# Patient Record
Sex: Female | Born: 1975 | Hispanic: Yes | Marital: Married | Smoking: Never smoker
Health system: Southern US, Community
[De-identification: ages and names within clinical notes are randomized; demographics above are authoritative.]

## PROBLEM LIST (undated history)

## (undated) DIAGNOSIS — I10 Essential (primary) hypertension: Secondary | ICD-10-CM

## (undated) DIAGNOSIS — E041 Nontoxic single thyroid nodule: Secondary | ICD-10-CM

## (undated) DIAGNOSIS — Z789 Other specified health status: Secondary | ICD-10-CM

## (undated) HISTORY — PX: CHOLECYSTECTOMY: SHX55

## (undated) HISTORY — PX: APPENDECTOMY: SHX54

## (undated) HISTORY — DX: Essential (primary) hypertension: I10

---

## 2005-05-17 ENCOUNTER — Ambulatory Visit: Payer: Self-pay | Admitting: Family Medicine

## 2013-09-25 ENCOUNTER — Emergency Department: Payer: Self-pay | Admitting: Emergency Medicine

## 2013-09-25 LAB — CBC WITH DIFFERENTIAL/PLATELET
Basophil #: 0.1 10*3/uL (ref 0.0–0.1)
Basophil %: 1.1 %
EOS ABS: 0.1 10*3/uL (ref 0.0–0.7)
Eosinophil %: 0.9 %
HCT: 40.4 % (ref 35.0–47.0)
HGB: 13.5 g/dL (ref 12.0–16.0)
LYMPHS PCT: 24.4 %
Lymphocyte #: 2.6 10*3/uL (ref 1.0–3.6)
MCH: 29.1 pg (ref 26.0–34.0)
MCHC: 33.4 g/dL (ref 32.0–36.0)
MCV: 87 fL (ref 80–100)
Monocyte #: 0.6 x10 3/mm (ref 0.2–0.9)
Monocyte %: 5.5 %
NEUTROS ABS: 7.2 10*3/uL — AB (ref 1.4–6.5)
Neutrophil %: 68.1 %
Platelet: 201 10*3/uL (ref 150–440)
RBC: 4.64 10*6/uL (ref 3.80–5.20)
RDW: 13.3 % (ref 11.5–14.5)
WBC: 10.6 10*3/uL (ref 3.6–11.0)

## 2013-09-25 LAB — URINALYSIS, COMPLETE
BLOOD: NEGATIVE
Bilirubin,UR: NEGATIVE
Glucose,UR: NEGATIVE mg/dL (ref 0–75)
KETONE: NEGATIVE
Nitrite: NEGATIVE
Ph: 8 (ref 4.5–8.0)
Protein: NEGATIVE
RBC,UR: NONE SEEN /HPF (ref 0–5)
Specific Gravity: 1.004 (ref 1.003–1.030)
Squamous Epithelial: 4
WBC UR: 2 /HPF (ref 0–5)

## 2013-09-25 LAB — COMPREHENSIVE METABOLIC PANEL
ALT: 19 U/L (ref 12–78)
ANION GAP: 4 — AB (ref 7–16)
AST: 12 U/L — AB (ref 15–37)
Albumin: 3.8 g/dL (ref 3.4–5.0)
Alkaline Phosphatase: 88 U/L
BUN: 10 mg/dL (ref 7–18)
Bilirubin,Total: 0.2 mg/dL (ref 0.2–1.0)
CHLORIDE: 105 mmol/L (ref 98–107)
CO2: 28 mmol/L (ref 21–32)
Calcium, Total: 8.9 mg/dL (ref 8.5–10.1)
Creatinine: 0.6 mg/dL (ref 0.60–1.30)
EGFR (African American): >60
EGFR (Non-African Amer.): >60
GLUCOSE: 112 mg/dL — AB (ref 65–99)
OSMOLALITY: 274 (ref 275–301)
Potassium: 3.8 mmol/L (ref 3.5–5.1)
Sodium: 137 mmol/L (ref 136–145)
Total Protein: 8.5 g/dL — ABNORMAL HIGH (ref 6.4–8.2)

## 2013-09-25 LAB — LIPASE, BLOOD: LIPASE: 155 U/L (ref 73–393)

## 2013-10-08 ENCOUNTER — Ambulatory Visit: Payer: Self-pay | Admitting: Surgery

## 2013-10-17 ENCOUNTER — Ambulatory Visit: Payer: Self-pay | Admitting: Surgery

## 2013-10-18 LAB — PATHOLOGY REPORT

## 2014-01-21 ENCOUNTER — Ambulatory Visit: Payer: Self-pay | Admitting: Primary Care

## 2014-10-19 NOTE — Op Note (Signed)
PATIENT NAME:  Sandra Little, Keylin MR#:  [id number] DATE OF BIRTH:  December 10, 1975  DATE OF PROCEDURE:  10/17/2013  PREOPERATIVE DIAGNOSIS: Symptomatic cholelithiasis.   POSTOPERATIVE DIAGNOSIS: Symptomatic cholelithiasis.   PROCEDURE: Laparoscopic cholecystectomy.   SURGEON: Dionne Miloichard Hisako Bugh, M.D.   ASSISTANT: Alpha GulaJosh Storm, PA-S.   INDICATIONS: This is a patient with recurrent epigastric and right upper quadrant pain associated with fatty food intolerance and work-up showing gallstones. Preoperatively, we discussed rationale for surgery, the options of observation, risk of bleeding, infection, recurrence of symptoms, failure to resolve her symptoms, open procedure, bile duct damage, bile duct leak, retained common bile duct stone, any of which could require further surgery and/or ERCP stent and papillotomy. This was reviewed for her and her family in the preop holding area with the aid of an interpreter. She understood and agreed to proceed.   FINDINGS: Multiple large gallstones requiring enlargement of the upper incision.   DESCRIPTION OF PROCEDURE: The patient was induced to general anesthesia, given IV antibiotics. VTE prophylaxis was in place. She was prepped and draped in a sterile fashion. Marcaine was infiltrated in skin and subcutaneous tissues around the periumbilical area. Incision was made. Veress needle was placed. Pneumoperitoneum was obtained and a 5 mm trocar port was placed. The abdominal cavity was explored and under direct vision a 10 mm epigastric port and two lateral 5 mm ports were placed. The gallbladder was placed on tension. Adhesions were taken down bluntly. The peritoneum over the infundibulum was incised bluntly. The cystic duct gallbladder junction was well identified and doubly clipped and divided. The cystic artery was doubly clipped and divided in two branches, and the gallbladder was taken from the gallbladder fossa with electrocautery and passed out through the epigastric  port site with the aid of an Endo Catch bag. The area was checked for hemostasis with electrocautery and found to be adequate. The area was irrigated with copious amounts of normal saline. There was no sign of bleeding, bile leak or bowel injury. The camera was placed in the epigastric site to view back at the periumbilical site. There was no sign of adhesions or bowel injury. Therefore, pneumoperitoneum was released. All ports were removed. Fascial edges of the enlarged epigastric port site were approximated with figure-of-eight 0 Vicryl, then 4-0 subcuticular Monocryl was used on all skin edges. Steri-Strips, Mastisol and sterile dressings were placed.   The patient tolerated the procedure well. There were no complications. She was taken to the recovery room in stable condition to be discharged in the care of her family. Follow-up in 10 days.   ____________________________ Adah Salvageichard E. Excell Seltzerooper, MD rec:sg D: 10/17/2013 08:12:15 ET T: 10/17/2013 08:25:20 ET JOB#: [id number]  cc: Adah Salvageichard E. Excell Seltzerooper, MD, <Dictator> Lattie HawICHARD E Virgen Belland MD ELECTRONICALLY SIGNED 10/17/2013 16:32

## 2014-10-19 NOTE — H&P (Signed)
PATIENT NAME:  Sandra Little, Jaclynn MR#:  [id number] DATE OF BIRTH:  1976-05-06  DATE OF ADMISSION:  10/17/2013  DATE OF OPERATION:  Will be 10/17/13.  CHIEF COMPLAINT: Recurrent right upper quadrant pain.   HISTORY OF PRESENT ILLNESS: This is a 39 year old female patient with a history of recurrent epigastric and right upper quadrant pain associated with fatty food intolerance. Her symptoms started several weeks ago. She has not had much nausea and vomiting or diarrhea but her pain is in the epigastrium and right upper quadrant, occasionally radiating to her back but not significantly. She is here for elective laparoscopic cholecystectomy for control of her symptoms following a workup showing gallstones.   PAST MEDICAL HISTORY: Hypertension.   PAST SURGICAL HISTORY: None.   ALLERGIES: None.   MEDICATIONS: Hydrochlorothiazide.   FAMILY HISTORY: Noncontributory.   SOCIAL HISTORY: The patient is a nonsmoker, nondrinker.   REVIEW OF SYSTEMS: A 10-system review has been performed and negative with the exception of that mentioned in the history of present illness and her ROS is documented in the office chart.   PHYSICAL EXAMINATION: GENERAL: Healthy female patient.  HEENT: No scleral icterus.  NECK: No palpable neck nodes.  CHEST: Clear to auscultation.  CARDIAC: Regular rate and rhythm.  ABDOMEN: Soft, nontender.  EXTREMITIES: Without edema.  NEUROLOGIC: Grossly intact.  INTEGUMENT: No jaundice.   LABORATORY, DIAGNOSTIC AND RADIOLOGICAL DATA: 1.  Ultrasound shows gallstones in the gallbladder, common bile duct 4 mm.  2.  Liver function tests are within normal limits. White blood cell count of 10, hemoglobin and hematocrit of 13 and 40 with a platelet count of 201. Electrolytes are within normal limits.   ASSESSMENT AND PLAN: This is a patient with symptomatic cholelithiasis. She is here for elective laparoscopic cholecystectomy for control of her symptoms. The rationale for this has  been discussed. The options of observation have been reviewed. The risks of bleeding, infection, recurrence of symptoms, failure to resolve her symptoms, open procedure, bile duct damage, bile duct leak, retained common bile duct stone, any of which could require further surgery and/or endoscopic retrograde cholangiopancreatography, stent and papillotomy have all been reviewed. She understood and agreed to proceed.   ____________________________ Adah Salvageichard E. Excell Seltzerooper, MD rec:cs D: 10/16/2013 15:24:32 ET T: 10/16/2013 15:46:15 ET JOB#: [id number]  cc: Adah Salvageichard E. Excell Seltzerooper, MD, <Dictator> Lattie HawICHARD E Larysa Pall MD ELECTRONICALLY SIGNED 10/16/2013 18:19

## 2015-06-24 IMAGING — US ABDOMEN ULTRASOUND LIMITED
1 series · 14 of 25 positions shown · non-contrast
Comparison: None currently available

CLINICAL DATA: Right upper quadrant pain and nausea

EXAM:
US ABDOMEN LIMITED - RIGHT UPPER QUADRANT

[Series 1: abdomen ultrasound limited · 0.27mm/px · 14 of 34 slices shown]
[im 1/34]
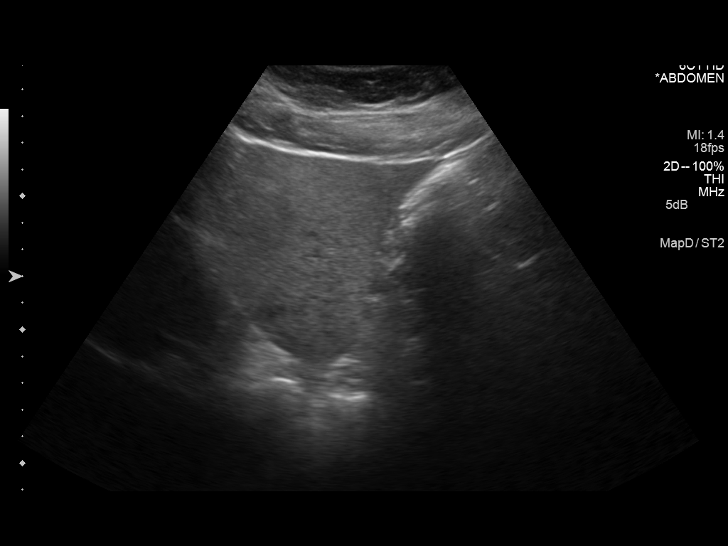
[im 3/34]
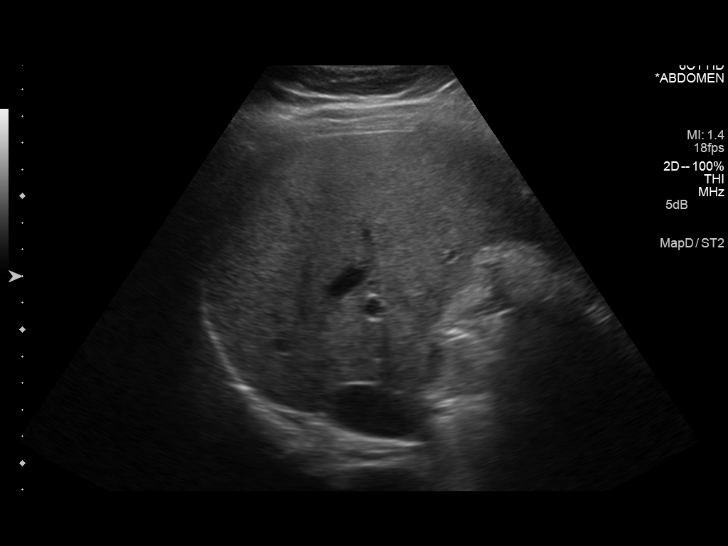
[im 6/34]
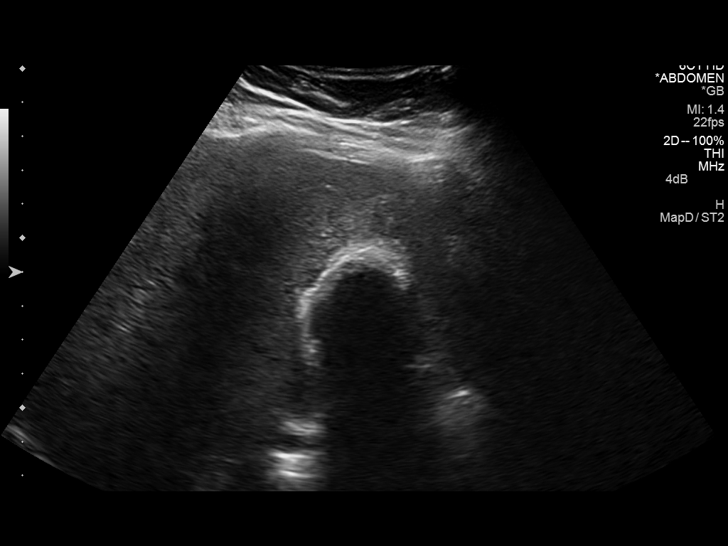
[im 9/34]
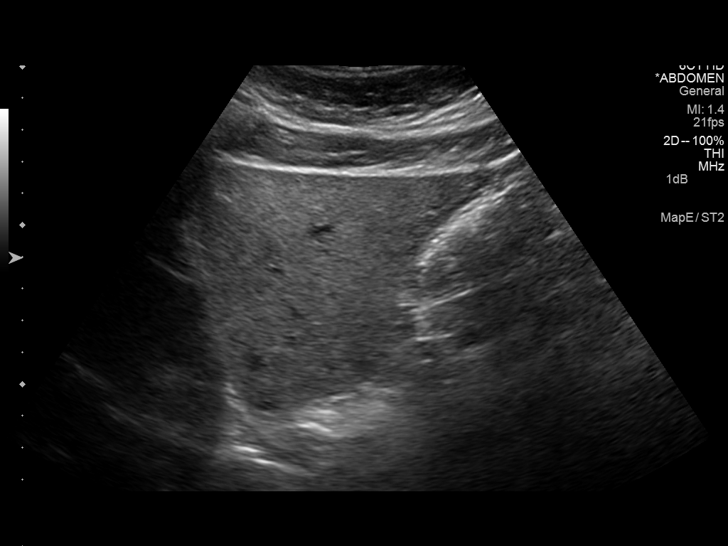
[im 12/34]
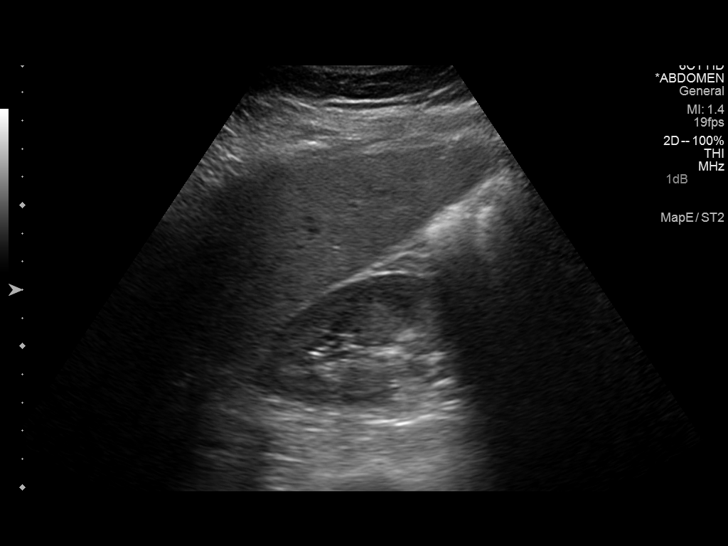
[im 13/34]
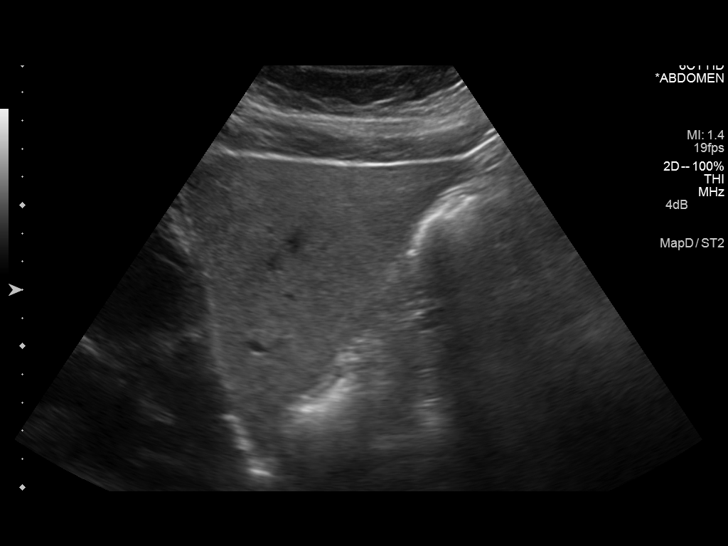
[im 16/34]
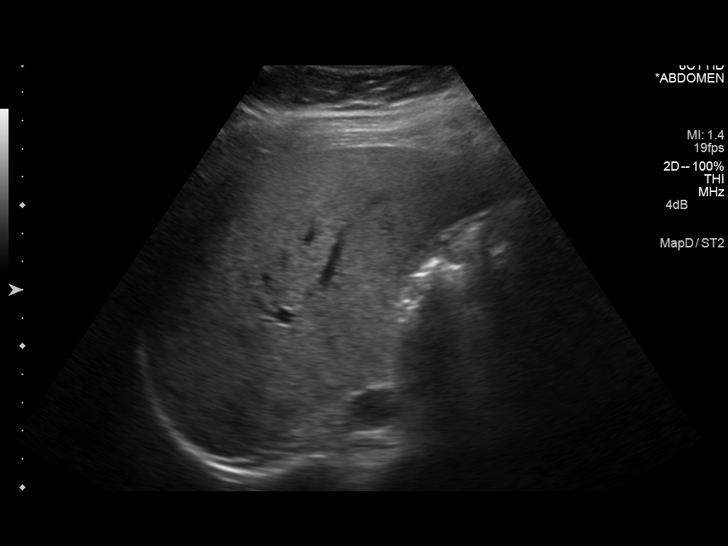
[im 18/34]
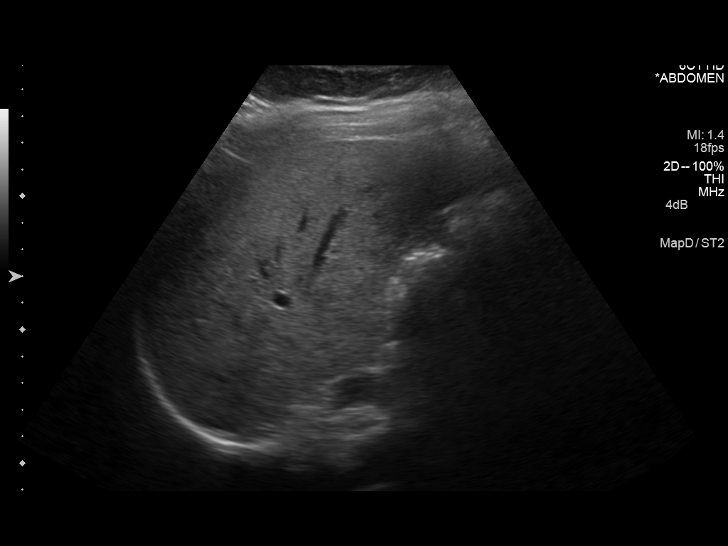
[im 21/34]
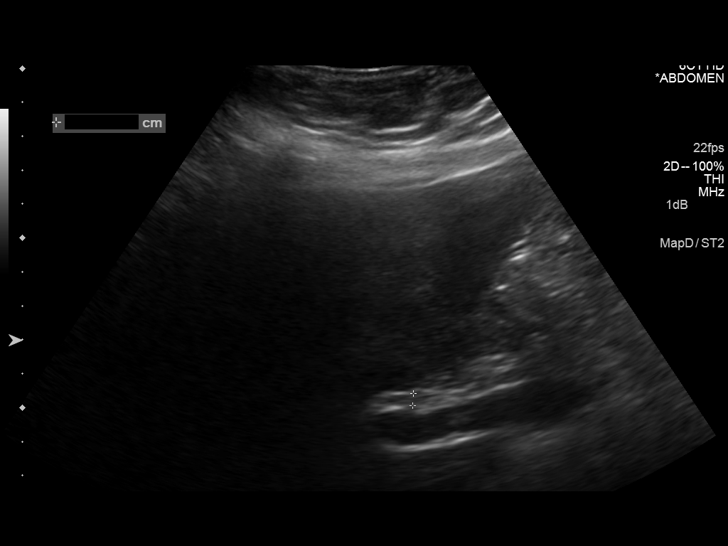
[im 23/34]
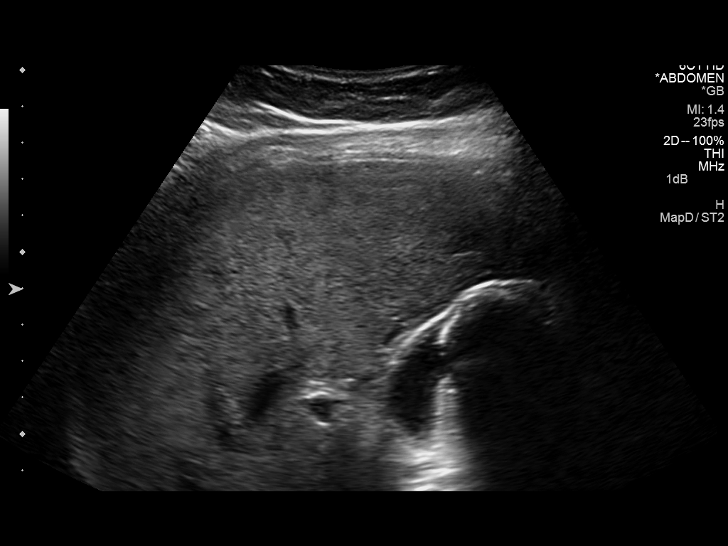
[im 25/34]
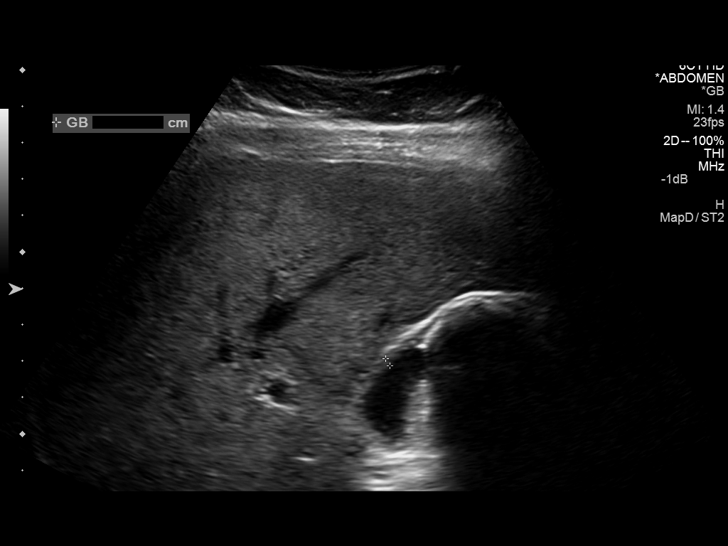
[im 28/34]
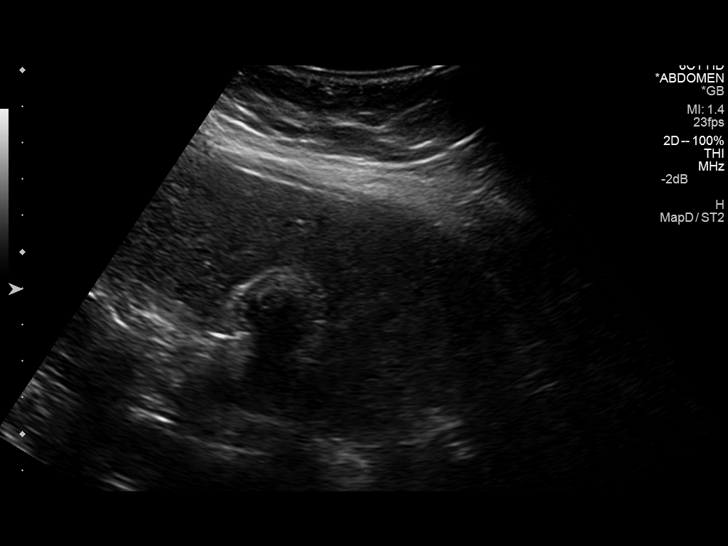
[im 31/34]
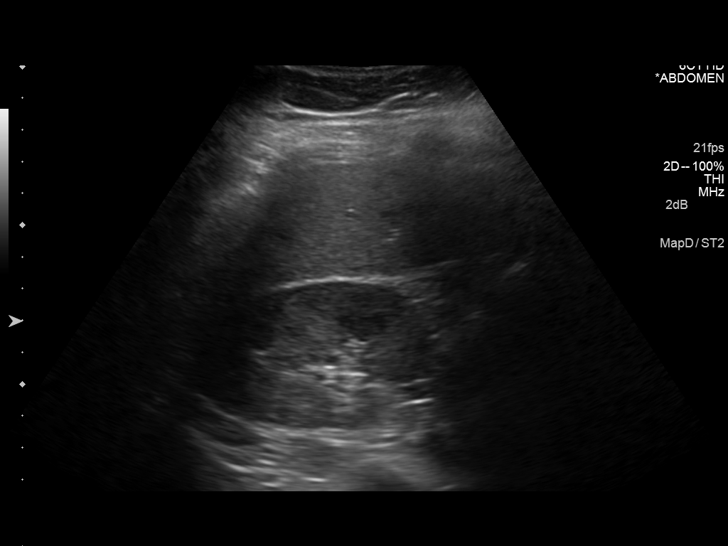
[im 34/34]
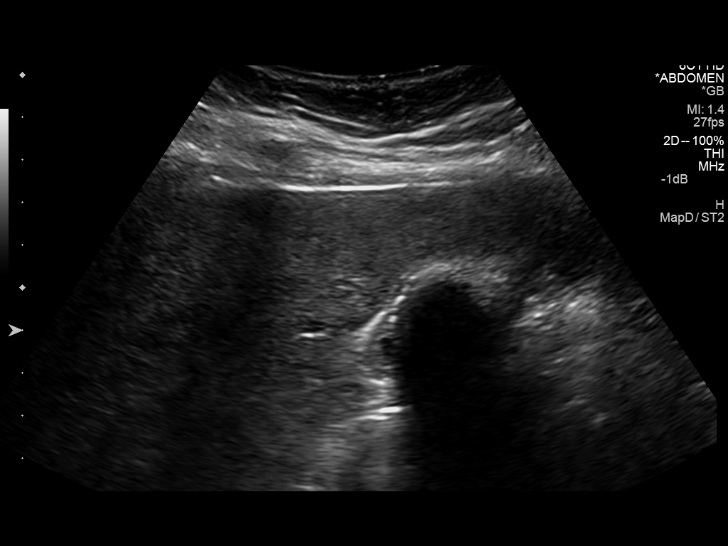

[14 of 25 positions shown; findings below may reference images not displayed]

FINDINGS: Gallbladder:

Large shadowing gallstone or gallstones in the gallbladder fundus.
The remainder of the gallbladder is not distended. There is no wall
thickening or focal tenderness.

Common bile duct:

Diameter: 4 mm.  Where visualized, no filling defect.

Liver:

No focal lesion identified. Borderline hyperechoic parenchyma, which
may reflect mild fatty infiltration. Antegrade flow in the imaged
portal venous system.
IMPRESSION: Cholelithiasis without acute cholecystitis.

## 2017-01-19 ENCOUNTER — Emergency Department
Admission: EM | Admit: 2017-01-19 | Discharge: 2017-01-19 | Disposition: A | Discharge status: Home / Self Care | Payer: Commercial Managed Care - PPO | Attending: Emergency Medicine | Admitting: Emergency Medicine

## 2017-01-19 ENCOUNTER — Emergency Department: Payer: Commercial Managed Care - PPO

## 2017-01-19 DIAGNOSIS — R079 Chest pain, unspecified: Secondary | ICD-10-CM | POA: Diagnosis present

## 2017-01-19 DIAGNOSIS — Z79899 Other long term (current) drug therapy: Secondary | ICD-10-CM | POA: Diagnosis not present

## 2017-01-19 DIAGNOSIS — R61 Generalized hyperhidrosis: Secondary | ICD-10-CM | POA: Diagnosis not present

## 2017-01-19 HISTORY — DX: Nontoxic single thyroid nodule: E04.1

## 2017-01-19 LAB — LIPASE, BLOOD: Lipase: 35 U/L (ref 11–51)

## 2017-01-19 LAB — HEPATIC FUNCTION PANEL
ALBUMIN: 4.5 g/dL (ref 3.5–5.0)
ALK PHOS: 80 U/L (ref 38–126)
ALT: 33 U/L (ref 14–54)
AST: 41 U/L (ref 15–41)
BILIRUBIN TOTAL: 0.6 mg/dL (ref 0.3–1.2)
Bilirubin, Direct: <0.1 mg/dL — ABNORMAL LOW (ref 0.1–0.5)
Total Protein: 8.1 g/dL (ref 6.5–8.1)

## 2017-01-19 LAB — CBC
HCT: 38.9 % (ref 35.0–47.0)
Hemoglobin: 13.5 g/dL (ref 12.0–16.0)
MCH: 29.8 pg (ref 26.0–34.0)
MCHC: 34.7 g/dL (ref 32.0–36.0)
MCV: 85.7 fL (ref 80.0–100.0)
PLATELETS: 197 10*3/uL (ref 150–440)
RBC: 4.54 MIL/uL (ref 3.80–5.20)
RDW: 13.6 % (ref 11.5–14.5)
WBC: 6.2 10*3/uL (ref 3.6–11.0)

## 2017-01-19 LAB — BASIC METABOLIC PANEL
Anion gap: 8 (ref 5–15)
BUN: 12 mg/dL (ref 6–20)
CHLORIDE: 107 mmol/L (ref 101–111)
CO2: 24 mmol/L (ref 22–32)
CREATININE: 0.52 mg/dL (ref 0.44–1.00)
Calcium: 8.7 mg/dL — ABNORMAL LOW (ref 8.9–10.3)
GFR calc non Af Amer: >60 mL/min (ref >60)
GLUCOSE: 111 mg/dL — AB (ref 65–99)
Potassium: 3.7 mmol/L (ref 3.5–5.1)
Sodium: 139 mmol/L (ref 135–145)

## 2017-01-19 LAB — TROPONIN I
Troponin I: <0.03 ng/mL (ref <0.03)
Troponin I: <0.03 ng/mL (ref <0.03)

## 2017-01-19 MED ORDER — FAMOTIDINE 20 MG PO TABS: 20.0000 mg | ORAL_TABLET | Freq: Two times a day (BID) | ORAL | 0 refills | Status: AC

## 2017-01-19 MED ORDER — KETOROLAC TROMETHAMINE 30 MG/ML IJ SOLN
15.0000 mg | INTRAMUSCULAR | Status: DC
  Filled 2017-01-19: qty 1

## 2017-01-19 NOTE — ED Provider Notes (Signed)
[facility] Emergency Department Provider Note  ____________________________________________  Time seen: Approximately 2:50 PM  I have reviewed the triage vital signs and the nursing notes.   HISTORY  Chief Complaint Chest Pain  Encounter completed with Spanish interpreter at bedside  HPI Sandra Little is a 41 y.o. female who complains of 2 episodes of central chest pain over the last 24 hours in the center of the chest, nonradiating, no shortness of breath. Associated with diaphoresis but no vomiting or radiation. Not exertional, not pleuritic. She had pain like this before a month or so ago which resolved on its own. No aggravating or alleviating factors that she can pinpoint. No recent travel trauma hospitalizations or surgeries. Status post cholecystectomy. No pain with eating. Normal oral intake.  Doesn't think this could be acid reflux because her husband had acid reflux, and his symptoms seemed like they were different than hers.     Past Medical History:  Diagnosis Date  . Thyroid nodule      There are no active problems to display for this patient.    Past Surgical History:  Procedure Laterality Date  . CHOLECYSTECTOMY       Prior to Admission medications   Medication Sig Start Date End Date Taking? Authorizing Provider  Norgestimate-Ethinyl Estradiol Triphasic (ORTHO TRI-CYCLEN, 28,) 0.18/0.215/0.25 MG-35 MCG tablet Take 1 tablet by mouth daily.   Yes [provider]  famotidine (PEPCID) 20 MG tablet Take 1 tablet (20 mg total) by mouth 2 (two) times daily. 01/19/17   Sharman Cheek, MD     Allergies Patient has no known allergies.   No family history on file.  Social History Social History  Substance Use Topics  . Smoking status: Never Smoker  . Smokeless tobacco: Never Used  . Alcohol use No    Review of Systems  Constitutional:   No fever or chills.  ENT:   No sore throat. No  rhinorrhea. Cardiovascular:   Positive as above chest pain without syncope. Respiratory:   No dyspnea or cough. Gastrointestinal:   Negative for abdominal pain, vomiting and diarrhea.  Musculoskeletal:   Negative for focal pain or swelling All other systems reviewed and are negative except as documented above in ROS and HPI.  ____________________________________________   PHYSICAL EXAM:  VITAL SIGNS: ED Triage Vitals  Enc Vitals Group     BP 01/19/17 0948 (!) 155/92     Pulse Rate 01/19/17 0948 74     Resp 01/19/17 0948 15     Temp 01/19/17 0948 99.3 F (37.4 C)     Temp Source 01/19/17 0948 Oral     SpO2 01/19/17 0948 100 %     Weight 01/19/17 0950 169 lb 3 oz (76.7 kg)     Height 01/19/17 0950 5\' 1"  (1.549 m)     Head Circumference --      Peak Flow --      Pain Score --      Pain Loc --      Pain Edu? --      Excl. in GC? --    Examined with nurse and interpreter at bedside Vital signs reviewed, nursing assessments reviewed.   Constitutional:   Alert and oriented. Well appearing and in no distress. Eyes:   No scleral icterus.  EOMI. No nystagmus. No conjunctival pallor. PERRL. ENT   Head:   Normocephalic and atraumatic.   Nose:   No congestion/rhinnorhea.    Mouth/Throat:   MMM, no pharyngeal erythema. No  peritonsillar mass.    Neck:   No meningismus. Full ROM Hematological/Lymphatic/Immunilogical:   No cervical lymphadenopathy. Cardiovascular:   RRR. Symmetric bilateral radial and DP pulses.  No murmurs.  Respiratory:   Normal respiratory effort without tachypnea/retractions. Breath sounds are clear and equal bilaterally. No wheezes/rales/rhonchi. Gastrointestinal:   Soft and nontender. Non distended. There is no CVA tenderness.  No rebound, rigidity, or guarding. Genitourinary:   deferred Musculoskeletal:   Normal range of motion in all extremities. No joint effusions.  No lower extremity tenderness.  No edema. Anterior chest wall tender over the  sternum in the area of indicated pain, reproducing her symptoms. Neurologic:   Normal speech and language.  Motor grossly intact. No gross focal neurologic deficits are appreciated.  Skin:    Skin is warm, dry and intact. No rash noted.  No petechiae, purpura, or bullae.  ____________________________________________    LABS (pertinent positives/negatives) (all labs ordered are listed, but only abnormal results are displayed) Labs Reviewed  BASIC METABOLIC PANEL - Abnormal; Notable for the following:       Result Value   Glucose, Bld 111 (*)    Calcium 8.7 (*)    All other components within normal limits  HEPATIC FUNCTION PANEL - Abnormal; Notable for the following:    Bilirubin, Direct <0.1 (*)    All other components within normal limits  CBC  TROPONIN I  LIPASE, BLOOD  TROPONIN I   ____________________________________________   EKG  Interpreted by me  Date: 01/19/2017  Rate: 76  Rhythm: normal sinus rhythm  QRS Axis: normal  Intervals: normal  ST/T Wave abnormalities: normal  Conduction Disutrbances: none  Narrative Interpretation: unremarkable      ____________________________________________    RADIOLOGY  Dg Chest 2 View  Result Date: 01/19/2017 CLINICAL DATA:  Chest pain for 2 days increased today, shortness of breath chest pain and LEFT and mid chest with also some pain down RIGHT side of abdomen EXAM: CHEST  2 VIEW COMPARISON:  None FINDINGS: Enlargement of cardiac silhouette. Mediastinal contours and pulmonary vascularity normal. Lungs clear. No pulmonary infiltrate, pleural effusion or pneumothorax. Bones unremarkable. Surgical clips RIGHT upper quadrant consistent with history of cholecystectomy. IMPRESSION: Enlargement of cardiac silhouette without acute infiltrate. Electronically Signed   By: Ulyses SouthwardMark  Boles M.D.   On: 01/19/2017 10:21     ____________________________________________   PROCEDURES Procedures  ____________________________________________   INITIAL IMPRESSION / ASSESSMENT AND PLAN / ED COURSE  Pertinent labs & imaging results that were available during my care of the patient were reviewed by me and considered in my medical decision making (see chart for details).    Clinical Course as of Jan 19 1449  Wed Jan 19, 2017  1236 Atypical chest pain. Initial w/u neg. Check delta trop, add on LFTs. If neg, stable for outpt follow up.   [PS]    Clinical Course User Index [PS] Sharman CheekStafford, Gabriela Irigoyen, MD    ----------------------------------------- 2:51 PM on 01/19/2017 -----------------------------------------  Additional workup negative. Pain is overall resolved and the patient has had occasional fleeting moments lasting just a few minutes at a time, nonexertional nonpleuritic. Considering the patient's symptoms, medical history, and physical examination today, I have low suspicion for ACS, PE, TAD, pneumothorax, carditis, mediastinitis, pneumonia, CHF, or sepsis.  Presentation is most consistent with chest wall pain. Trial of antacids, follow up with primary care.   ____________________________________________   FINAL CLINICAL IMPRESSION(S) / ED DIAGNOSES  Final diagnoses:  Nonspecific chest pain      New Prescriptions  FAMOTIDINE (PEPCID) 20 MG TABLET    Take 1 tablet (20 mg total) by mouth 2 (two) times daily.     Portions of this note were generated with dragon dictation software. Dictation errors may occur despite best attempts at proofreading.    Sharman CheekStafford, Jeroline Wolbert, MD 01/19/17 [phone]

## 2017-01-19 NOTE — ED Notes (Signed)
Interpreter request made for pt discharge.

## 2017-01-19 NOTE — ED Notes (Signed)
Pt ambulatory to toilet without difficulty. 

## 2017-01-19 NOTE — Discharge Instructions (Signed)
Your tests today were all unremarkable.  Try famotidine to see if it helps with these symptoms, and follow up with your doctor.    Results for orders placed or performed during the hospital encounter of 01/19/17  Basic metabolic panel  Result Value Ref Range   Sodium 139 135 - 145 mmol/L   Potassium 3.7 3.5 - 5.1 mmol/L   Chloride 107 101 - 111 mmol/L   CO2 24 22 - 32 mmol/L   Glucose, Bld 111 (H) 65 - 99 mg/dL   BUN 12 6 - 20 mg/dL   Creatinine, Ser 1.610.52 0.44 - 1.00 mg/dL   Calcium 8.7 (L) 8.9 - 10.3 mg/dL   GFR calc non Af Amer >60 >60 mL/min   GFR calc Af Amer >60 >60 mL/min   Anion gap 8 5 - 15  CBC  Result Value Ref Range   WBC 6.2 3.6 - 11.0 K/uL   RBC 4.54 3.80 - 5.20 MIL/uL   Hemoglobin 13.5 12.0 - 16.0 g/dL   HCT 09.638.9 04.535.0 - 40.947.0 %   MCV 85.7 80.0 - 100.0 fL   MCH 29.8 26.0 - 34.0 pg   MCHC 34.7 32.0 - 36.0 g/dL   RDW 81.113.6 91.411.5 - 78.214.5 %   Platelets 197 150 - 440 K/uL  Troponin I  Result Value Ref Range   Troponin I <0.03 <0.03 ng/mL  Lipase, blood  Result Value Ref Range   Lipase 35 11 - 51 U/L  Hepatic function panel  Result Value Ref Range   Total Protein 8.1 6.5 - 8.1 g/dL   Albumin 4.5 3.5 - 5.0 g/dL   AST 41 15 - 41 U/L   ALT 33 14 - 54 U/L   Alkaline Phosphatase 80 38 - 126 U/L   Total Bilirubin 0.6 0.3 - 1.2 mg/dL   Bilirubin, Direct <9.5<0.1 (L) 0.1 - 0.5 mg/dL   Indirect Bilirubin NOT CALCULATED 0.3 - 0.9 mg/dL  Troponin I  Result Value Ref Range   Troponin I <0.03 <0.03 ng/mL   Dg Chest 2 View  Result Date: 01/19/2017 CLINICAL DATA:  Chest pain for 2 days increased today, shortness of breath chest pain and LEFT and mid chest with also some pain down RIGHT side of abdomen EXAM: CHEST  2 VIEW COMPARISON:  None FINDINGS: Enlargement of cardiac silhouette. Mediastinal contours and pulmonary vascularity normal. Lungs clear. No pulmonary infiltrate, pleural effusion or pneumothorax. Bones unremarkable. Surgical clips RIGHT upper quadrant consistent with  history of cholecystectomy. IMPRESSION: Enlargement of cardiac silhouette without acute infiltrate. Electronically Signed   By: Ulyses SouthwardMark  Boles M.D.   On: 01/19/2017 10:21

## 2017-01-19 NOTE — ED Notes (Signed)
Pt report pain has resolved at this time, does not wish to receive medication.  Pt concerned of severity of pain when it does come on, and is asking if we will be doing an ultrasound or any further imaging.  MD notified.

## 2017-01-19 NOTE — ED Triage Notes (Signed)
Pt c/o chest pain for the past 2 days, states last night the pain was worse with diaphoresis. Denies vomiting or diarrhea or SOB..Marland Kitchen

## 2017-05-11 ENCOUNTER — Other Ambulatory Visit: Payer: Self-pay | Admitting: Primary Care

## 2017-05-11 ENCOUNTER — Other Ambulatory Visit: Payer: Self-pay | Admitting: Family Medicine

## 2017-05-11 DIAGNOSIS — R1011 Right upper quadrant pain: Secondary | ICD-10-CM

## 2017-05-17 ENCOUNTER — Ambulatory Visit
Admission: RE | Admit: 2017-05-17 | Discharge: 2017-05-17 | Disposition: A | Discharge status: Home / Self Care | Payer: Commercial Managed Care - PPO | Source: Ambulatory Visit | Attending: Primary Care | Admitting: Primary Care

## 2017-05-17 DIAGNOSIS — Z9049 Acquired absence of other specified parts of digestive tract: Secondary | ICD-10-CM | POA: Insufficient documentation

## 2017-05-17 DIAGNOSIS — R1011 Right upper quadrant pain: Secondary | ICD-10-CM | POA: Diagnosis not present

## 2017-09-22 ENCOUNTER — Encounter: Payer: Self-pay | Admitting: *Deleted

## 2017-09-23 ENCOUNTER — Encounter
Admission: RE | Disposition: A | Discharge status: Home / Self Care | Payer: Self-pay | Source: Ambulatory Visit | Attending: Gastroenterology

## 2017-09-23 ENCOUNTER — Ambulatory Visit: Payer: Commercial Managed Care - PPO | Admitting: Registered Nurse

## 2017-09-23 ENCOUNTER — Ambulatory Visit
Admission: RE | Admit: 2017-09-23 | Discharge: 2017-09-23 | Disposition: A | Discharge status: Home / Self Care | Payer: Commercial Managed Care - PPO | Source: Ambulatory Visit | Attending: Gastroenterology | Admitting: Gastroenterology

## 2017-09-23 ENCOUNTER — Encounter: Payer: Self-pay | Admitting: *Deleted

## 2017-09-23 DIAGNOSIS — K2270 Barrett's esophagus without dysplasia: Secondary | ICD-10-CM | POA: Diagnosis not present

## 2017-09-23 DIAGNOSIS — K2950 Unspecified chronic gastritis without bleeding: Secondary | ICD-10-CM | POA: Diagnosis not present

## 2017-09-23 DIAGNOSIS — B9681 Helicobacter pylori [H. pylori] as the cause of diseases classified elsewhere: Secondary | ICD-10-CM | POA: Diagnosis not present

## 2017-09-23 DIAGNOSIS — R1013 Epigastric pain: Secondary | ICD-10-CM | POA: Diagnosis present

## 2017-09-23 HISTORY — PX: ESOPHAGOGASTRODUODENOSCOPY (EGD) WITH PROPOFOL: SHX5813

## 2017-09-23 HISTORY — DX: Other specified health status: Z78.9

## 2017-09-23 LAB — POCT PREGNANCY, URINE: PREG TEST UR: NEGATIVE

## 2017-09-23 SURGERY — ESOPHAGOGASTRODUODENOSCOPY (EGD) WITH PROPOFOL
Anesthesia: General

## 2017-09-23 MED ORDER — SODIUM CHLORIDE 0.9 % IV SOLN: INTRAVENOUS | Status: DC

## 2017-09-23 MED ORDER — SODIUM CHLORIDE 0.9 % IV SOLN
INTRAVENOUS | Status: DC
  Administered 2017-09-23: 12:00:00 via INTRAVENOUS

## 2017-09-23 MED ORDER — GLYCOPYRROLATE 0.2 MG/ML IJ SOLN
INTRAMUSCULAR | Status: DC | PRN
  Administered 2017-09-23: 0.2 mg via INTRAVENOUS

## 2017-09-23 MED ORDER — PROPOFOL 10 MG/ML IV BOLUS
INTRAVENOUS | Status: AC
  Filled 2017-09-23: qty 20

## 2017-09-23 MED ORDER — PROPOFOL 10 MG/ML IV BOLUS
INTRAVENOUS | Status: DC | PRN
  Administered 2017-09-23: 100 mg via INTRAVENOUS

## 2017-09-23 MED ORDER — LIDOCAINE HCL (PF) 2 % IJ SOLN
INTRAMUSCULAR | Status: AC
  Filled 2017-09-23: qty 10

## 2017-09-23 MED ORDER — PROPOFOL 500 MG/50ML IV EMUL
INTRAVENOUS | Status: DC | PRN
  Administered 2017-09-23: 200 ug/kg/min via INTRAVENOUS

## 2017-09-23 MED ORDER — PROPOFOL 500 MG/50ML IV EMUL
INTRAVENOUS | Status: AC
  Filled 2017-09-23: qty 50

## 2017-09-23 MED ORDER — GLYCOPYRROLATE 0.2 MG/ML IJ SOLN
INTRAMUSCULAR | Status: AC
  Filled 2017-09-23: qty 1

## 2017-09-23 MED ORDER — LIDOCAINE HCL (CARDIAC) 20 MG/ML IV SOLN
INTRAVENOUS | Status: DC | PRN
  Administered 2017-09-23: 100 mg via INTRAVENOUS

## 2017-09-23 NOTE — H&P (Signed)
Outpatient short stay form Pre-procedure 09/23/2017 11:32 AM Sandra DeemMartin U Justus Droke MD  Primary Physician: [facility]  Reason for visit: EGD  History of present illness: Patient is a 42 year old female presenting today as above.  She has a complaint of epigastric and right upper quadrant pain.  Had been started on some omeprazole daily however she only took that for about 2 weeks.  She continues to have the discomfort.  She has had her gallbladder removed previously.  She occasionally takes Pepcid.  Will rarely take an NSAID.    Current Facility-Administered Medications:  .  0.9 %  sodium chloride infusion, , Intravenous, Continuous, Sandra DeemSkulskie, Connor Foxworthy U, MD, Last Rate: 20 mL/hr at 09/23/17 1131 .  0.9 %  sodium chloride infusion, , Intravenous, Continuous, Sandra DeemSkulskie, Shellene Sweigert U, MD  Medications Prior to Admission  Medication Sig Dispense Refill Last Dose  . famotidine (PEPCID) 20 MG tablet Take 1 tablet (20 mg total) by mouth 2 (two) times daily. (Patient not taking: Reported on 09/23/2017) 60 tablet 0 Not Taking at Unknown time  . Norgestimate-Ethinyl Estradiol Triphasic (ORTHO TRI-CYCLEN, 28,) 0.18/0.215/0.25 MG-35 MCG tablet Take 1 tablet by mouth daily.   Not Taking at Unknown time  . omeprazole (PRILOSEC) 20 MG capsule Take 20 mg by mouth daily.   Not Taking at Unknown time     No Known Allergies   Past Medical History:  Diagnosis Date  . Medical history non-contributory   . Thyroid nodule     Review of systems:      Physical Exam    Heart and lungs: Regular rate and rhythm without rub or gallop, lungs are bilaterally clear.    HEENT:, Normocephalic atraumatic eyes are anicteric    Other:    Pertinant exam for procedure: Soft nontender nondistended bowel sounds positive normoactive    Planned proceedures: EGD and indicated procedures. I have discussed the risks benefits and complications of procedures to include not limited to bleeding, infection,  perforation and the risk of sedation and the patient wishes to proceed.    Sandra DeemMartin U Elisheva Fallas, MD Gastroenterology 09/23/2017  11:32 AM

## 2017-09-23 NOTE — Anesthesia Preprocedure Evaluation (Signed)
Anesthesia Evaluation  Patient identified by MRN, date of birth, ID band Patient awake    Reviewed: Allergy & Precautions, H&P , NPO status , Patient's Chart, lab work & pertinent test results, reviewed documented beta blocker date and time   Airway Mallampati: II   Neck ROM: full    Dental  (+) Teeth Intact   Pulmonary neg pulmonary ROS,    Pulmonary exam normal        Cardiovascular Exercise Tolerance: Good negative cardio ROS Normal cardiovascular exam Rhythm:regular Rate:Normal     Neuro/Psych negative neurological ROS  negative psych ROS   GI/Hepatic negative GI ROS, Neg liver ROS,   Endo/Other  negative endocrine ROS  Renal/GU negative Renal ROS  negative genitourinary   Musculoskeletal   Abdominal   Peds  Hematology negative hematology ROS (+)   Anesthesia Other Findings Past Medical History: No date: Medical history non-contributory No date: Thyroid nodule Past Surgical History: No date: APPENDECTOMY No date: CHOLECYSTECTOMY BMI    Body Mass Index:  32.22 kg/m     Reproductive/Obstetrics negative OB ROS                             Anesthesia Physical Anesthesia Plan  ASA: III  Anesthesia Plan: General   Post-op Pain Management:    Induction:   PONV Risk Score and Plan:   Airway Management Planned:   Additional Equipment:   Intra-op Plan:   Post-operative Plan:   Informed Consent: I have reviewed the patients History and Physical, chart, labs and discussed the procedure including the risks, benefits and alternatives for the proposed anesthesia with the patient or authorized representative who has indicated his/her understanding and acceptance.   Dental Advisory Given  Plan Discussed with: CRNA  Anesthesia Plan Comments:         Anesthesia Quick Evaluation

## 2017-09-23 NOTE — Anesthesia Post-op Follow-up Note (Signed)
Anesthesia QCDR form completed.        

## 2017-09-23 NOTE — Op Note (Signed)
[facility] Gastroenterology Patient Name: Sandra Little Procedure Date: 09/23/2017 11:34 AM MRN: [id number] Account #: [id number] Date of Birth: [birth year] Admit Type: Outpatient Age: 42 Room: [facility] ENDO ROOM 3 Gender: Female Note Status: Finalized Procedure:            Upper GI endoscopy Indications:          Epigastric abdominal pain, Abdominal pain in the right                        upper quadrant Providers:            Christena Deem, MD Referring MD:         No Local Md, MD (Referring MD) Medicines:            Monitored Anesthesia Care Complications:        No immediate complications. Procedure:            Pre-Anesthesia Assessment:                       - ASA Grade Assessment: II - A patient with mild                        systemic disease.                       After obtaining informed consent, the endoscope was                        passed under direct vision. Throughout the procedure,                        the patient's blood pressure, pulse, and oxygen                        saturations were monitored continuously. The Endoscope                        was introduced through the mouth, and advanced to the                        second part of duodenum. The upper GI endoscopy was                        accomplished without difficulty. The patient tolerated                        the procedure well. Findings:      LA Grade A (one or more mucosal breaks less than 5 mm, not extending       between tops of 2 mucosal folds) esophagitis with no bleeding was found.       Biopsies were taken with a cold forceps for histology.      The exam of the esophagus was otherwise normal.      Diffuse mild inflammation characterized by congestion (edema), erythema       and granularity was found in the gastric body and in the gastric antrum.       Biopsies were taken with a cold forceps for histology. Biopsies were       taken with a cold forceps for  Helicobacter pylori testing.      The cardia  and gastric fundus were normal on retroflexion.      The examined duodenum was normal. Impression:           - LA Grade A erosive esophagitis. Biopsied.                       - Gastritis. Biopsied.                       - Normal examined duodenum. Recommendation:       - Use Protonix (pantoprazole) 40 mg PO daily daily.                       - Await pathology results.                       - Return to GI clinic in 4 weeks. Procedure Code(s):    --- Professional ---                       [phone], Esophagogastroduodenoscopy, flexible, transoral;                        with biopsy, single or multiple Diagnosis Code(s):    --- Professional ---                       K20.8, Other esophagitis                       K29.70, Gastritis, unspecified, without bleeding                       R10.13, Epigastric pain                       R10.11, Right upper quadrant pain CPT copyright 2016 American [employer] Association. All rights reserved. The codes documented in this report are preliminary and upon coder review may  be revised to meet current compliance requirements. Christena DeemMartin U Skulskie, MD 09/23/2017 11:56:48 AM This report has been signed electronically. Number of Addenda: 0 Note Initiated On: 09/23/2017 11:34 AM      [facility]

## 2017-09-23 NOTE — Transfer of Care (Signed)
Immediate Anesthesia Transfer of Care Note  Patient: Sandra Little  Procedure(s) Performed: ESOPHAGOGASTRODUODENOSCOPY (EGD) WITH PROPOFOL (N/A )  Patient Location: PACU  Anesthesia Type:General  Level of Consciousness: awake  Airway & Oxygen Therapy: Patient Spontanous Breathing  Post-op Assessment: Report given to RN  Post vital signs: stable  Last Vitals:  Vitals Value Taken Time  BP 141/87 09/23/2017 11:56 AM  Temp 36.1 C 09/23/2017 11:56 AM  Pulse 106 09/23/2017 11:57 AM  Resp 17 09/23/2017 11:57 AM  SpO2 100 % 09/23/2017 11:57 AM  Vitals shown include unvalidated device data.  Last Pain:  Vitals:   09/23/17 1156  TempSrc: Tympanic         Complications: No apparent anesthesia complications

## 2017-09-26 ENCOUNTER — Encounter: Payer: Self-pay | Admitting: Gastroenterology

## 2017-09-26 LAB — SURGICAL PATHOLOGY

## 2017-09-27 NOTE — Anesthesia Postprocedure Evaluation (Signed)
Anesthesia Post Note  Patient: Sandra Little  Procedure(s) Performed: ESOPHAGOGASTRODUODENOSCOPY (EGD) WITH PROPOFOL (N/A )  Patient location during evaluation: PACU Anesthesia Type: General Level of consciousness: awake and alert Pain management: pain level controlled Vital Signs Assessment: post-procedure vital signs reviewed and stable Respiratory status: spontaneous breathing, nonlabored ventilation, respiratory function stable and patient connected to nasal cannula oxygen Cardiovascular status: blood pressure returned to baseline and stable Postop Assessment: no apparent nausea or vomiting Anesthetic complications: no     Last Vitals:  Vitals:   09/23/17 1155 09/23/17 1156  BP: (!) 141/87 (!) 141/87  Pulse: (!) 108 (!) 111  Resp: 16 20  Temp: 36.4 C (!) 36.1 C  SpO2: 100% 100%    Last Pain:  Vitals:   09/23/17 1156  TempSrc: Tympanic                 Yevette EdwardsJames G Adams

## 2018-06-14 IMAGING — US US ABDOMEN LIMITED
1 series · 14 of 25 positions shown · non-contrast
Comparison: September 26, 2013

CLINICAL DATA: Upper abdominal pain for several months

EXAM:
ULTRASOUND ABDOMEN LIMITED RIGHT UPPER QUADRANT

[Series 1: us abdomen limited · 0.20mm/px · 14 of 33 slices shown]
[im 1/33]
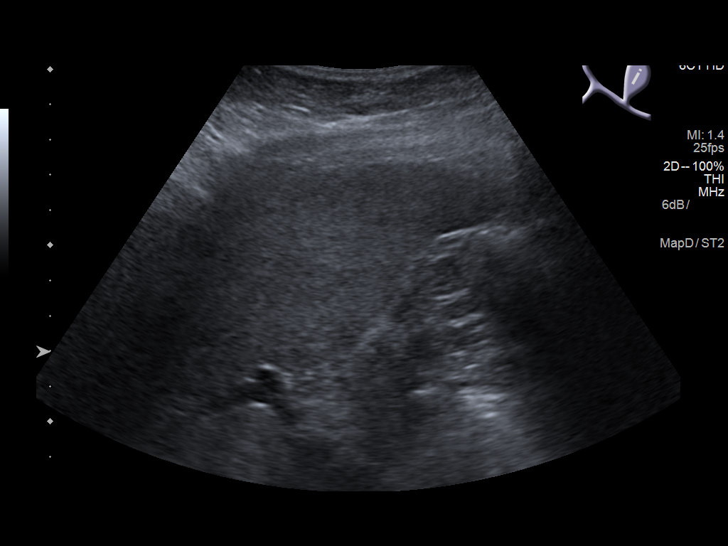
[im 3/33]
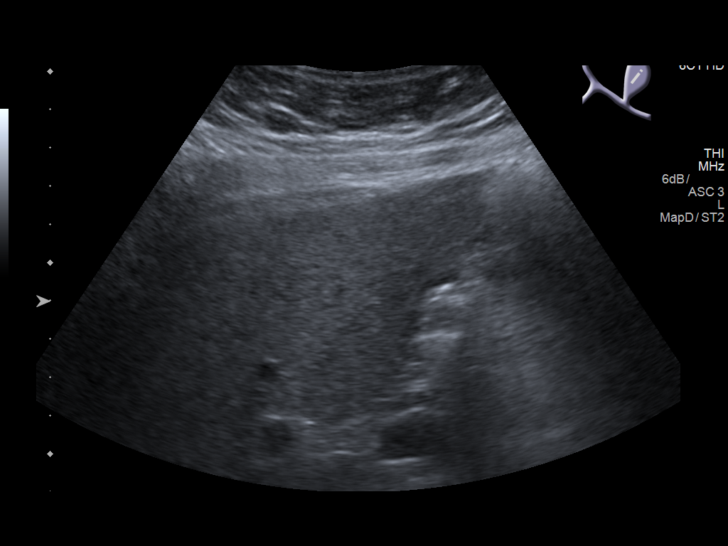
[im 6/33]
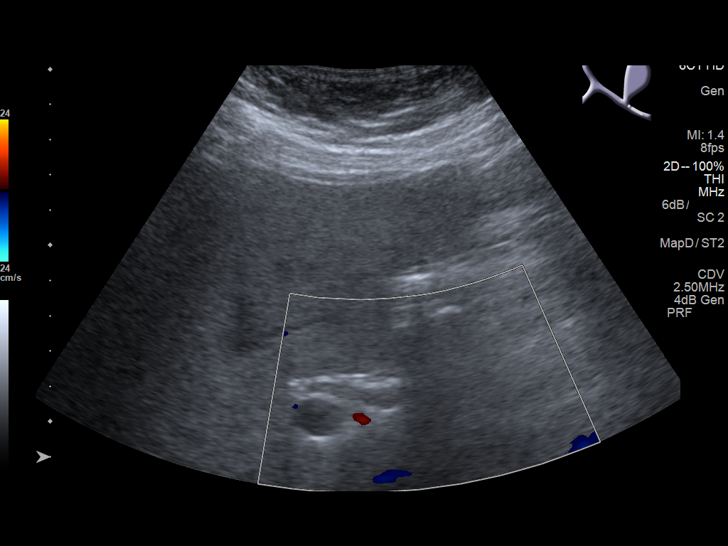
[im 9/33]
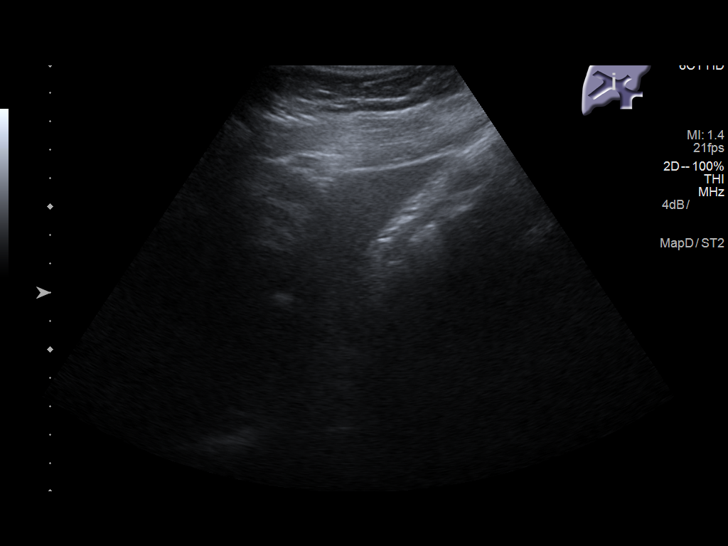
[im 11/33]
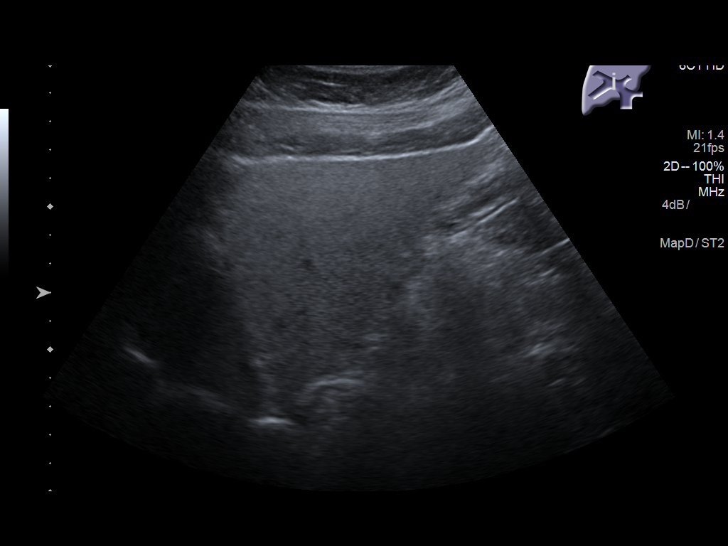
[im 13/33]
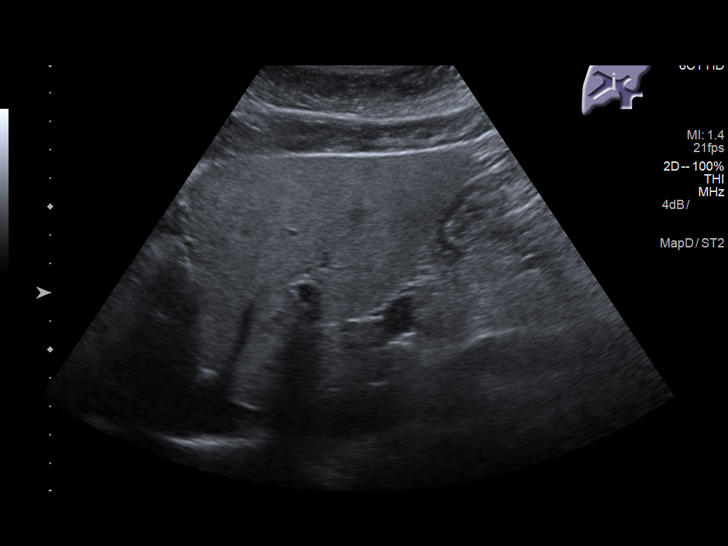
[im 15/33]
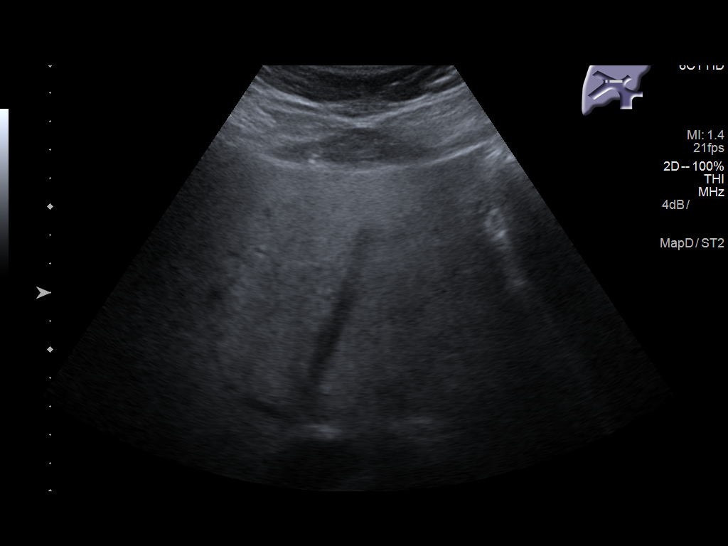
[im 18/33]
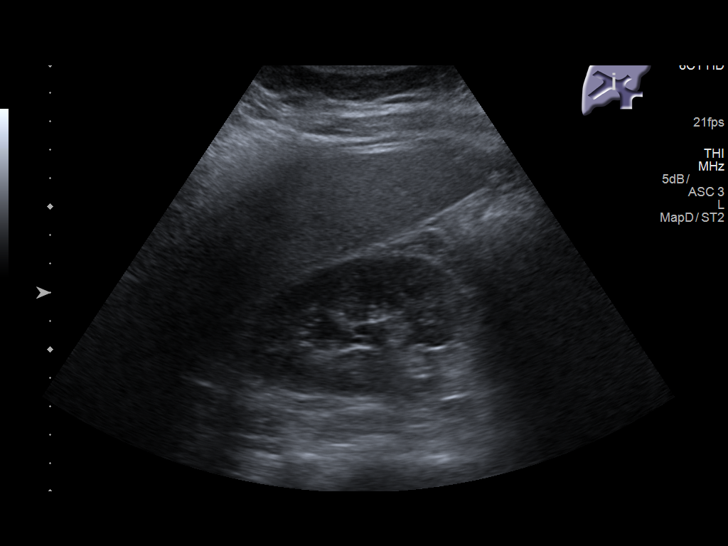
[im 21/33]
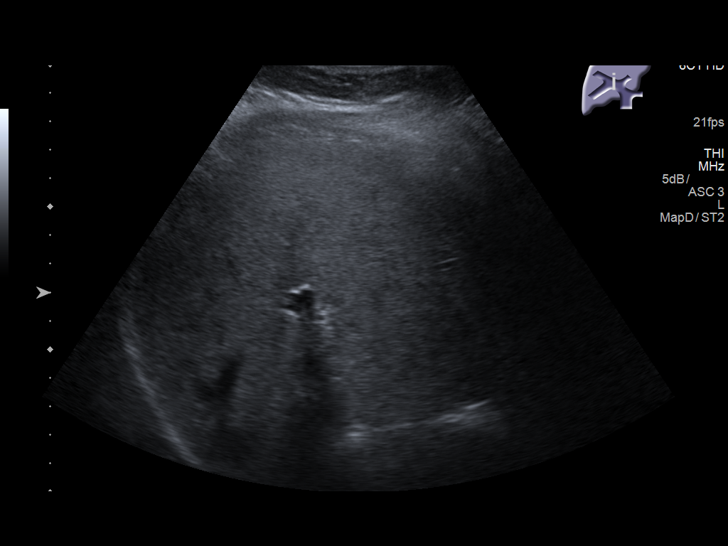
[im 22/33]
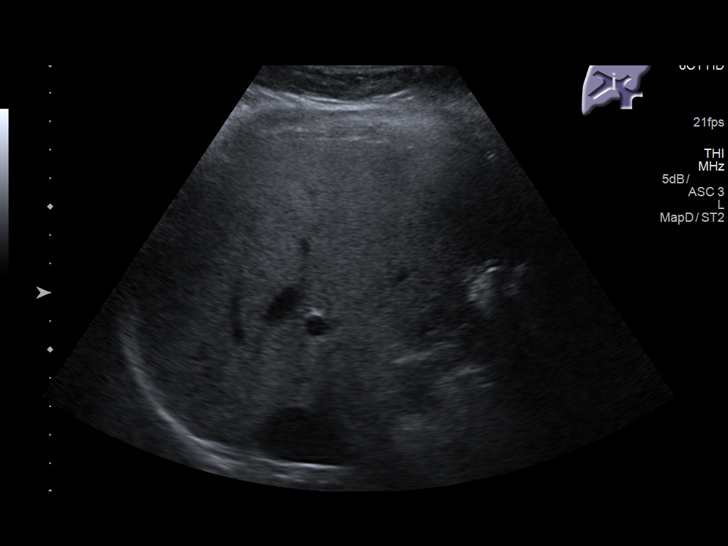
[im 25/33]
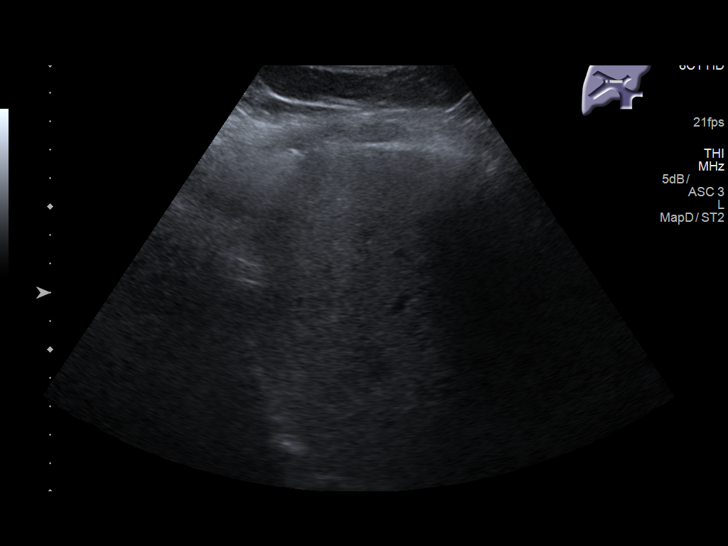
[im 27/33]
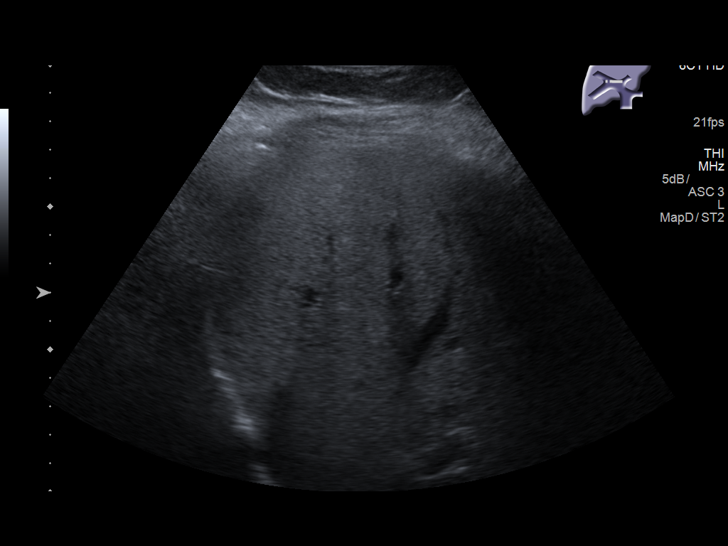
[im 30/33]
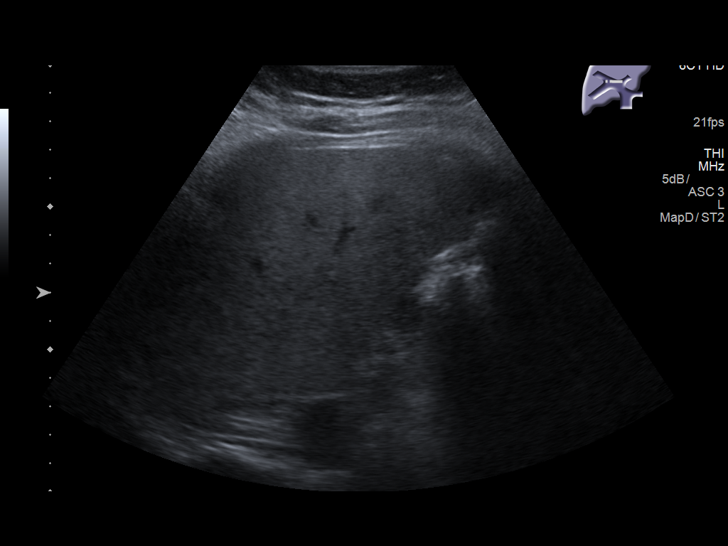
[im 33/33]
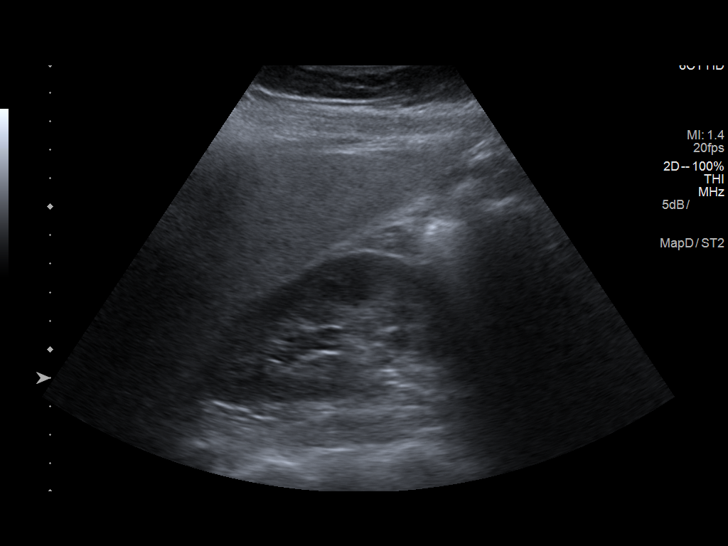

[14 of 25 positions shown; findings below may reference images not displayed]

FINDINGS: Gallbladder:

Surgically absent.

Common bile duct:

Diameter: 6 mm. No intrahepatic or extrahepatic biliary duct
dilatation.

Liver:

No focal lesion identified. Within normal limits in parenchymal
echogenicity. Portal vein is patent on color Doppler imaging with
normal direction of blood flow towards the liver.
IMPRESSION: Gallbladder absent.  Study otherwise unremarkable.

## 2018-10-17 IMAGING — CR DG CHEST 2V
2 series · 2 of 2 positions shown · non-contrast
Comparison: None

CLINICAL DATA: Chest pain for 2 days increased today, shortness of
breath chest pain and LEFT and mid chest with also some pain down
RIGHT side of abdomen

EXAM:
CHEST  2 VIEW

[chest pa]
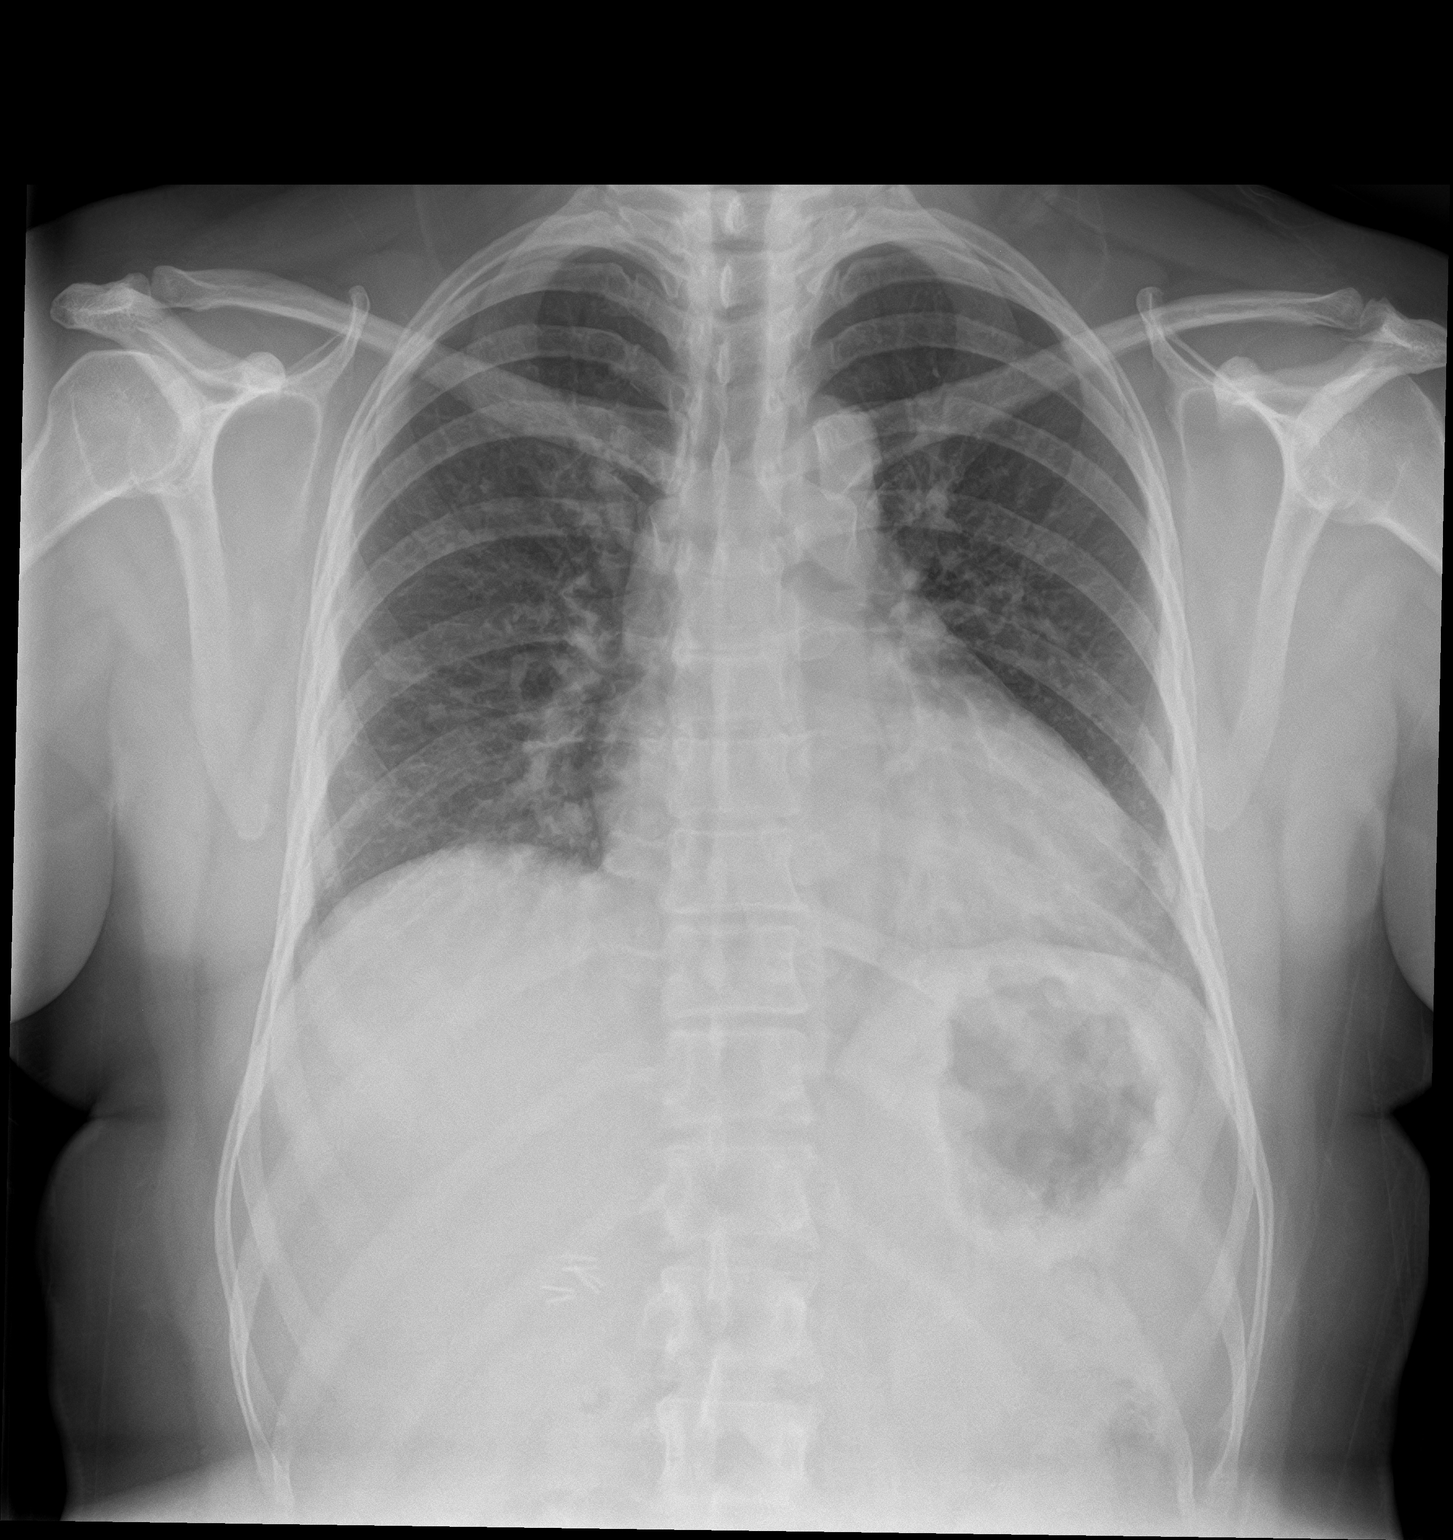

[chest lat]
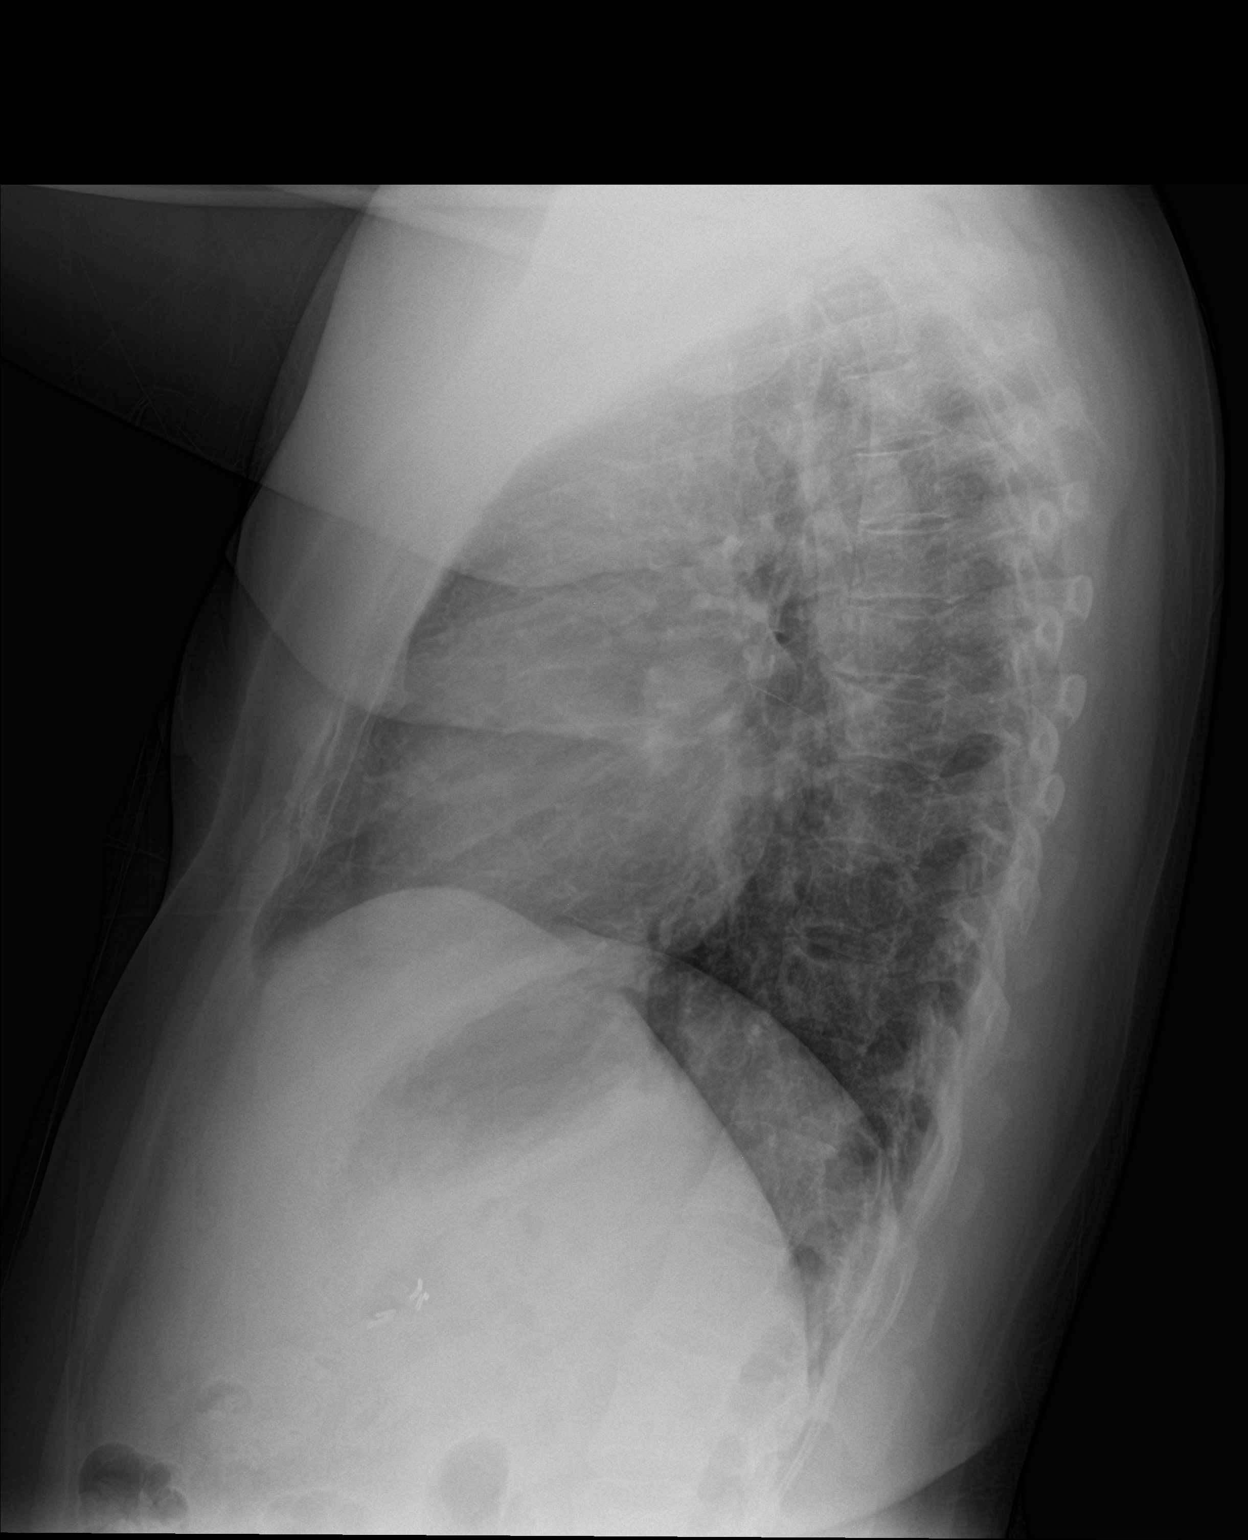

[2 of 2 positions shown; findings below may reference images not displayed]

FINDINGS: Enlargement of cardiac silhouette.

Mediastinal contours and pulmonary vascularity normal.

Lungs clear.

No pulmonary infiltrate, pleural effusion or pneumothorax.

Bones unremarkable.

Surgical clips RIGHT upper quadrant consistent with history of
cholecystectomy.
IMPRESSION: Enlargement of cardiac silhouette without acute infiltrate.

## 2021-01-01 ENCOUNTER — Other Ambulatory Visit: Payer: Self-pay | Admitting: Family Medicine

## 2021-01-01 DIAGNOSIS — Z1231 Encounter for screening mammogram for malignant neoplasm of breast: Secondary | ICD-10-CM

## 2021-08-13 ENCOUNTER — Other Ambulatory Visit: Payer: Self-pay | Admitting: Primary Care

## 2021-08-13 DIAGNOSIS — N946 Dysmenorrhea, unspecified: Secondary | ICD-10-CM

## 2021-08-13 DIAGNOSIS — R1084 Generalized abdominal pain: Secondary | ICD-10-CM

## 2021-08-13 DIAGNOSIS — R1011 Right upper quadrant pain: Secondary | ICD-10-CM

## 2021-08-18 ENCOUNTER — Ambulatory Visit: Payer: Commercial Managed Care - PPO

## 2022-01-22 ENCOUNTER — Other Ambulatory Visit: Payer: Self-pay | Admitting: *Deleted

## 2022-01-22 DIAGNOSIS — Z1231 Encounter for screening mammogram for malignant neoplasm of breast: Secondary | ICD-10-CM

## 2022-02-03 ENCOUNTER — Ambulatory Visit: Payer: Commercial Managed Care - PPO

## 2022-02-12 ENCOUNTER — Ambulatory Visit: Payer: Commercial Managed Care - PPO | Attending: Hematology and Oncology | Admitting: *Deleted

## 2022-02-12 ENCOUNTER — Ambulatory Visit
Admission: RE | Admit: 2022-02-12 | Discharge: 2022-02-12 | Disposition: A | Discharge status: Home / Self Care | Payer: Commercial Managed Care - PPO | Source: Ambulatory Visit | Attending: Obstetrics and Gynecology | Admitting: Obstetrics and Gynecology

## 2022-02-12 VITALS — BP 138/72 | Wt 186.7 lb

## 2022-02-12 DIAGNOSIS — Z1239 Encounter for other screening for malignant neoplasm of breast: Secondary | ICD-10-CM

## 2022-02-12 DIAGNOSIS — Z1231 Encounter for screening mammogram for malignant neoplasm of breast: Secondary | ICD-10-CM | POA: Insufficient documentation

## 2022-02-12 NOTE — Patient Instructions (Signed)
Explained breast self awareness with Sandra Little. Patient did not need a Pap smear today due to last Pap smear was 09/05/2019. Let her know [facility] will cover Pap smears every 3 years unless has a history of abnormal Pap smears. Referred patient to the [facility] for a screening mammogram. Appointment scheduled Friday, February 12, 2022 at 1140. Patient aware of appointment and will be there. Let patient know Delford Field will follow up with her within the next couple weeks with results of mammogram by letter or phone. Sandra Little verbalized understanding.  Valoree Agent, Kathaleen Maser, RN 10:39 AM

## 2022-02-12 NOTE — Progress Notes (Signed)
Ms. Sandra Little is a 46 y.o. female who presents to [facility] clinic today with no complaints.    Pap Smear: Pap smear not completed today. Last Pap smear was 09/05/2019 at [facility] clinic and was normal. Per patient has no history of an abnormal Pap smear. Last Pap smear result is available in Epic.   Physical exam: Breasts Breasts symmetrical. No skin abnormalities bilateral breasts. No nipple retraction bilateral breasts. No nipple discharge bilateral breasts. No lymphadenopathy. No lumps palpated bilateral breasts. No complaints of pain or tenderness on exam.      Pelvic/Bimanual Pap is not indicated today per [facility]CP guidelines.   Smoking History: Patient has never smoked.   Patient Navigation: Patient education provided. Access to services provided for patient through [employer] program. Spanish interpreter Kristeen Mans from [facility] provided.   Breast and Cervical Cancer Risk Assessment: Patient does not have family history of breast cancer, known genetic mutations, or radiation treatment to the chest before age 54. Patient does not have history of cervical dysplasia, immunocompromised, or DES exposure in-utero.  Risk Assessment     Risk Scores       02/12/2022   Last edited by: Narda Rutherford, LPN   5-year risk: 0.3 %   Lifetime risk: 4.6 %            A: [facility] exam without pap smear No complaints.  P: Referred patient to the [facility] for a screening mammogram. Appointment scheduled Friday, February 12, 2022 at 1140.  Priscille Heidelberg, RN 02/12/2022 10:39 AM

## 2022-11-23 ENCOUNTER — Other Ambulatory Visit: Payer: Self-pay

## 2022-11-23 DIAGNOSIS — Z1231 Encounter for screening mammogram for malignant neoplasm of breast: Secondary | ICD-10-CM

## 2022-12-23 ENCOUNTER — Encounter: Payer: Self-pay | Admitting: Primary Care

## 2023-02-14 ENCOUNTER — Ambulatory Visit
Admission: RE | Admit: 2023-02-14 | Discharge: 2023-02-14 | Disposition: A | Discharge status: Home / Self Care | Payer: Self-pay | Source: Ambulatory Visit | Attending: Obstetrics and Gynecology | Admitting: Obstetrics and Gynecology

## 2023-02-14 ENCOUNTER — Ambulatory Visit: Payer: Self-pay | Attending: Hematology and Oncology | Admitting: Hematology and Oncology

## 2023-02-14 VITALS — BP 139/79 | Wt 191.7 lb

## 2023-02-14 DIAGNOSIS — Z1231 Encounter for screening mammogram for malignant neoplasm of breast: Secondary | ICD-10-CM | POA: Insufficient documentation

## 2023-02-14 NOTE — Patient Instructions (Signed)
Taught Sandra Little about self breast awareness and gave educational materials to take home. Patient did not need a Pap smear today due to last Pap smear was in 12/21/22.  Let her know [facility] will cover Pap smears every 5 years unless has a history of abnormal Pap smears. Referred patient to the [facility] for screening mammogram. Appointment scheduled for 02/14/23. Patient aware of appointment and will be there. Let patient know will follow up with her within the next couple weeks with results. Sandra Little verbalized understanding.  Pascal Lux, NP 3:02 PM

## 2023-02-14 NOTE — Progress Notes (Signed)
Sandra Little is a 47 y.o. female who presents to [facility] clinic today with no complaints.    Pap Smear: Pap not smear completed today. Last Pap smear was 12/21/22 at [facility] clinic and was normal. Per patient has no history of an abnormal Pap smear. Last Pap smear result is available in Epic.   Physical exam: Breasts Breasts symmetrical. No skin abnormalities bilateral breasts. No nipple retraction bilateral breasts. No nipple discharge bilateral breasts. No lymphadenopathy. No lumps palpated bilateral breasts.  MS DIGITAL SCREENING TOMO BILATERAL  Result Date: 02/15/2022 CLINICAL DATA:  Screening. EXAM: DIGITAL SCREENING BILATERAL MAMMOGRAM WITH TOMOSYNTHESIS AND CAD TECHNIQUE: Bilateral screening digital craniocaudal and mediolateral oblique mammograms were obtained. Bilateral screening digital breast tomosynthesis was performed. The images were evaluated with computer-aided detection. COMPARISON:  None available. ACR Breast Density Category c: The breast tissue is heterogeneously dense, which may obscure small masses FINDINGS: There are no findings suspicious for malignancy. IMPRESSION: No mammographic evidence of malignancy. A result letter of this screening mammogram will be mailed directly to the patient. RECOMMENDATION: Screening mammogram in one year. (Code:SM-B-01Y) BI-RADS CATEGORY  1: Negative. Electronically Signed   By: Frederico Hamman M.D.   On: 02/15/2022 10:49         Pelvic/Bimanual Pap is not indicated today    Smoking History: Patient has never smoked and was not referred to quit line.    Patient Navigation: Patient education provided. Access to services provided for patient through [facility] program. Delos Haring interpreter provided. No transportation provided   Colorectal Cancer Screening: Per patient has never had colonoscopy completed No complaints today.    Breast and Cervical Cancer Risk Assessment: Patient does not have family history of breast cancer,  known genetic mutations, or radiation treatment to the chest before age 83. Patient does not have history of cervical dysplasia, immunocompromised, or DES exposure in-utero.  Risk Assessment   No risk assessment data for the current encounter  Risk Scores       02/12/2022   Last edited by: Narda Rutherford, LPN   5-year risk: 0.3%   Lifetime risk: 4.6%            A: B[facility]CP exam without pap smear No complaints with benign exam.   P: Referred patient to the [facility] for a screening mammogram. Appointment scheduled 02/14/23.  Ilda Basset A, NP 02/14/2023 3:01 PM
